# Patient Record
Sex: Male | Born: 2000 | Hispanic: No | Marital: Single | Smoking: Never smoker
Health system: Southern US, Community
[De-identification: ages and names within clinical notes are randomized; demographics above are authoritative.]

---

## 2020-08-07 ENCOUNTER — Other Ambulatory Visit: Payer: Self-pay

## 2020-08-07 ENCOUNTER — Emergency Department (HOSPITAL_COMMUNITY)
Admission: EM | Admit: 2020-08-07 | Discharge: 2020-08-07 | Disposition: A | Discharge status: Home / Self Care | Payer: Medicaid Other | Attending: Emergency Medicine | Admitting: Emergency Medicine

## 2020-08-07 DIAGNOSIS — H9201 Otalgia, right ear: Secondary | ICD-10-CM | POA: Diagnosis present

## 2020-08-07 DIAGNOSIS — H6121 Impacted cerumen, right ear: Secondary | ICD-10-CM

## 2020-08-07 MED ORDER — CARBAMIDE PEROXIDE 6.5 % OT SOLN
5.0000 [drp] | Freq: Once | OTIC | Status: AC
  Administered 2020-08-07: 5 [drp] via OTIC
  Filled 2020-08-07: qty 15

## 2020-08-07 NOTE — ED Provider Notes (Signed)
[facility] EMERGENCY DEPARTMENT Provider Note   CSN: [id number] Arrival date & time: 08/07/20  6546     History Chief Complaint  Patient presents with   Otalgia    Noah Michael is a 20 y.o. male.  HPI Patient is a 20 year old male who presents to the emergency department due to right ear pain.  Patient states that he has a history of recurring ear infections.  He tried flushing his ear last night but was unsuccessful.  States that he feels as if he has impacted earwax in the right ear.  Reports sinus drainage as well as pressure for the past week.  He has been taking DayQuil as well as NyQuil for this.  No other complaints.    No past medical history on file.  There are no problems to display for this patient.    No family history on file.     Home Medications Prior to Admission medications   Not on File    Allergies    Patient has no allergy information on record.  Review of Systems   Review of Systems  Constitutional:  Negative for fever.  HENT:  Positive for ear pain, rhinorrhea and sinus pressure. Negative for ear discharge.    Physical Exam Updated Vital Signs BP (!) 159/95 (BP Location: Left Arm)   Pulse 79   Temp 98.4 F (36.9 C) (Oral)   Resp 16   SpO2 100%   Physical Exam Vitals and nursing note reviewed.  Constitutional:      General: He is not in acute distress.    Appearance: He is well-developed.  HENT:     Head: Normocephalic and atraumatic.     Right Ear: External ear normal.     Left Ear: Tympanic membrane, ear canal and external ear normal. There is no impacted cerumen.     Ears:     Comments: Right ear and EAC appear normal.  Distal cerumen impaction noted.  Unable to visualize the right TM. Eyes:     General: No scleral icterus.       Right eye: No discharge.        Left eye: No discharge.     Conjunctiva/sclera: Conjunctivae normal.  Neck:     Trachea: No tracheal deviation.  Cardiovascular:     Rate and  Rhythm: Normal rate.  Pulmonary:     Effort: Pulmonary effort is normal. No respiratory distress.     Breath sounds: No stridor.  Abdominal:     General: There is no distension.  Musculoskeletal:        General: No swelling or deformity.     Cervical back: Neck supple.  Skin:    General: Skin is warm and dry.     Findings: No rash.  Neurological:     Mental Status: He is alert.     Cranial Nerves: Cranial nerve deficit: no gross deficits.   ED Results / Procedures / Treatments   Labs (all labs ordered are listed, but only abnormal results are displayed) Labs Reviewed - No data to display  EKG None  Radiology No results found.  Procedures Procedures   Medications Ordered in ED Medications  carbamide peroxide (DEBROX) 6.5 % OTIC (EAR) solution 5 drop (5 drops Right EAR Given 08/07/20 5035)    ED Course  I have reviewed the triage vital signs and the nursing notes.  Pertinent labs & imaging results that were available during my care of the patient were reviewed by me  and considered in my medical decision making (see chart for details).    MDM Rules/Calculators/A&P                          Patient is a 20 year old male who presents to the emergency department due to a right sided cerumen impaction as well as right ear pain.  Left ear, EAC, and TM appear normal.  Unable to visualize the right TM due to his cerumen impaction.  Myself as well as nursing staff have he irrigated the ear extensively with sterile saline as well as hydrogen peroxide.  I also placed Debrox drops.  I was able to remove a large amount of cerumen from the ear but could not completely resolve the impaction and thus could not visualize the right TM.  He states that his ear pain is actually improved significantly since having his ear irrigated.  Denies any persistent inner ear pain.  No drainage.  Patient given a referral to ENT.  Recommended he continue using the Debrox drops.  Discussed ear hygiene and  patient given information on ear hygiene as well.  Discussed return precautions.  Feel he is stable for discharge at this time and he is agreeable.    Final Clinical Impression(s) / ED Diagnoses Final diagnoses:  Impacted cerumen of right ear    Rx / DC Orders ED Discharge Orders     None        Placido Sou, PA-C 08/07/20 0943    Melene Plan, DO 08/08/20 1459

## 2020-08-07 NOTE — Discharge Instructions (Addendum)
Please continue to use your Debrox drops as needed.  I have attached information on ear irrigation as well as earwax buildup.  Below is the contact information for a local ear, nose, and throat clinic.  Please give them a call and schedule a follow-up appointment if your symptoms persist.  If you develop any new or worsening symptoms please come back to the emergency department.  It was a pleasure to meet you.

## 2020-08-07 NOTE — ED Triage Notes (Signed)
Pt bib EMS from home due to ear pain. 10/10 in right ear. Pt has history of recurring ear infections. Tried flushing ear at home. Noted sinus drainage x1 week.  Vitals WDL.

## 2020-08-16 ENCOUNTER — Other Ambulatory Visit: Payer: Self-pay

## 2020-08-16 ENCOUNTER — Encounter (HOSPITAL_COMMUNITY): Payer: Self-pay | Admitting: Emergency Medicine

## 2020-08-16 ENCOUNTER — Emergency Department (HOSPITAL_COMMUNITY)
Admission: EM | Admit: 2020-08-16 | Discharge: 2020-08-17 | Disposition: A | Discharge status: Home / Self Care | Payer: Medicaid Other | Attending: Student | Admitting: Student

## 2020-08-16 ENCOUNTER — Emergency Department (HOSPITAL_COMMUNITY): Payer: Medicaid Other

## 2020-08-16 DIAGNOSIS — M79641 Pain in right hand: Secondary | ICD-10-CM | POA: Diagnosis present

## 2020-08-16 DIAGNOSIS — Z5321 Procedure and treatment not carried out due to patient leaving prior to being seen by health care provider: Secondary | ICD-10-CM | POA: Insufficient documentation

## 2020-08-16 DIAGNOSIS — W228XXA Striking against or struck by other objects, initial encounter: Secondary | ICD-10-CM | POA: Insufficient documentation

## 2020-08-16 LAB — HIV ANTIBODY (ROUTINE TESTING W REFLEX): HIV Screen 4th Generation wRfx: NONREACTIVE

## 2020-08-16 NOTE — ED Provider Notes (Signed)
Emergency Medicine Provider Triage Evaluation Note  Noah Michael , a 20 y.o. male  was evaluated in triage.  Pt complains of right hand pain x1 week. He punched a door 1 week ago and has had constant pain since. He is left-hand dominant.  He is also requesting STD check. His partner tested positive for trich and chlamydia. No penile discharge, abdominal pain, fever/chills, or dysuria.  Review of Systems  Positive: arthralgia Negative: fever  Physical Exam  BP (!) 156/76 (BP Location: Right Arm)   Pulse (!) 120   Temp 98.4 F (36.9 C) (Oral)   Resp 16   SpO2 100%  Gen:   Awake, no distress   Resp:  Normal effort  MSK:   Moves extremities without difficulty  Other:  Deformity to right hand over 4th/5th metacarpal  Medical Decision Making  Medically screening exam initiated at 7:32 PM.  Appropriate orders placed.  Noah Michael was informed that the remainder of the evaluation will be completed by another provider, this initial triage assessment does not replace that evaluation, and the importance of remaining in the ED until their evaluation is complete.  X-ray STI panel   Noah Michael 08/16/20 1934    Noah Avena, MD 08/16/20 2047

## 2020-08-16 NOTE — ED Triage Notes (Signed)
Patient reports injury to right hand with swelling after punching a door last week . He is also requesting STD screening - denies any symptoms .

## 2020-08-17 LAB — RPR: RPR Ser Ql: NONREACTIVE

## 2020-08-24 ENCOUNTER — Encounter (HOSPITAL_COMMUNITY): Payer: Self-pay | Admitting: *Deleted

## 2020-08-24 ENCOUNTER — Other Ambulatory Visit: Payer: Self-pay

## 2020-08-24 ENCOUNTER — Emergency Department (HOSPITAL_COMMUNITY)
Admission: EM | Admit: 2020-08-24 | Discharge: 2020-08-24 | Disposition: A | Discharge status: Home / Self Care | Payer: Medicaid Other | Attending: Emergency Medicine | Admitting: Emergency Medicine

## 2020-08-24 DIAGNOSIS — W228XXA Striking against or struck by other objects, initial encounter: Secondary | ICD-10-CM | POA: Diagnosis not present

## 2020-08-24 DIAGNOSIS — S6991XA Unspecified injury of right wrist, hand and finger(s), initial encounter: Secondary | ICD-10-CM | POA: Diagnosis present

## 2020-08-24 DIAGNOSIS — M79641 Pain in right hand: Secondary | ICD-10-CM | POA: Insufficient documentation

## 2020-08-24 DIAGNOSIS — S62306A Unspecified fracture of fifth metacarpal bone, right hand, initial encounter for closed fracture: Secondary | ICD-10-CM | POA: Insufficient documentation

## 2020-08-24 NOTE — ED Triage Notes (Signed)
Pt was seen on 8/7 for same but left before completing care. He punched a wall and has right hand pain.

## 2020-08-24 NOTE — Discharge Instructions (Addendum)
Please follow-up with hand surgery.  Dr. Thomos Lemons information is below.  I would recommend giving them a call first thing tomorrow morning.  If you develop any new or worsening symptoms please come back to the emergency department.  It was good to see you again.

## 2020-08-24 NOTE — Progress Notes (Signed)
Orthopedic Tech Progress Note Patient Details:  Noah Michael 13-May-2000 [id number]  Ortho Devices Type of Ortho Device: Ulna gutter splint Ortho Device/Splint Location: RUE Ortho Device/Splint Interventions: Ordered, Application, Adjustment   Post Interventions Patient Tolerated: Well Instructions Provided: Care of device  [name] 08/24/2020, 6:20 PM

## 2020-08-24 NOTE — ED Provider Notes (Signed)
[facility] EMERGENCY DEPARTMENT Provider Note   CSN: [id number] Arrival date & time: 08/24/20  1125     History Chief Complaint  Patient presents with   Hand Pain    Noah Michael is a 20 y.o. male.  HPI Patient is a 20 year old left-hand-dominant male who presents to the emergency department due to right hand pain.  Patient states that he was angry and punched a metal door with a closed right fist on July 28.  He began experiencing pain and swelling along the dorsum of the hand.  He was initially evaluated on August 7 for this and had an x-ray with findings as noted below:  FINDINGS: There is a an impacted mildly angulated fracture in the distal aspect of the fifth metacarpal identified. No other fracture or dislocation is seen. No soft tissue abnormality is noted.  Patient initially left the emergency department before being evaluated on August 7.  He states that he has continued to have mild pain and swelling in the hand but states that it has mostly resolved.  Denies any numbness or weakness in the hand but notes a persistent deformity along the fifth metacarpal so he came back to the emergency department for evaluation.    History reviewed. No pertinent past medical history.  There are no problems to display for this patient.   History reviewed. No pertinent surgical history.     History reviewed. No pertinent family history.  Social History   Tobacco Use   Smoking status: Never  Substance Use Topics   Alcohol use: Never   Drug use: Never    Home Medications Prior to Admission medications   Not on File    Allergies    Patient has no known allergies.  Review of Systems   Review of Systems  Musculoskeletal:  Positive for arthralgias and joint swelling.  Skin:  Negative for wound.  Neurological:  Negative for weakness and numbness.   Physical Exam Updated Vital Signs BP 126/72 (BP Location: Right Arm)   Pulse 82   Temp 98.4 F  (36.9 C) (Oral)   Resp 15   SpO2 99%   Physical Exam Vitals and nursing note reviewed.  Constitutional:      General: He is not in acute distress.    Appearance: He is well-developed.  HENT:     Head: Normocephalic and atraumatic.     Right Ear: External ear normal.     Left Ear: External ear normal.  Eyes:     General: No scleral icterus.       Right eye: No discharge.        Left eye: No discharge.     Conjunctiva/sclera: Conjunctivae normal.  Neck:     Trachea: No tracheal deviation.  Cardiovascular:     Rate and Rhythm: Normal rate.  Pulmonary:     Effort: Pulmonary effort is normal. No respiratory distress.     Breath sounds: No stridor.  Abdominal:     General: There is no distension.  Musculoskeletal:        General: Tenderness and deformity present. No swelling.     Cervical back: Neck supple.     Comments: Mild TTP noted along the distal right fifth metacarpal.  Obvious deformity at the site.  Mild edema in the region.  No overlying skin changes.  No increased warmth.  Grip strength intact.  Distal sensation intact in all 5 fingers of the hand.  2+ radial pulses.  Skin:  General: Skin is warm and dry.     Findings: No rash.  Neurological:     Mental Status: He is alert.     Cranial Nerves: Cranial nerve deficit: no gross deficits.   ED Results / Procedures / Treatments   Labs (all labs ordered are listed, but only abnormal results are displayed) Labs Reviewed - No data to display  EKG None  Radiology No results found.  Procedures Procedures   Medications Ordered in ED Medications - No data to display  ED Course  I have reviewed the triage vital signs and the nursing notes.  Pertinent labs & imaging results that were available during my care of the patient were reviewed by me and considered in my medical decision making (see chart for details).    MDM Rules/Calculators/A&P                          Patient is a 20 year old left-hand-dominant  male who presents to the emergency department for evaluation of what appears to be a right fifth metacarpal fracture that occurred on July 28.  He initially came to the emergency department on August 7 and had an x-ray but left the emergency department before being evaluated.  Patient states his swelling and pain has almost completely resolved but still has a deformity in the region.  He is neurovascularly intact in the hand distal to the injury.  Will place in an ulnar gutter splint and give him a referral to hand surgery.  Recommended that he reach out to hand surgery first thing tomorrow morning to schedule an appointment for reevaluation.  Feel the patient is stable for discharge at this time and he is agreeable.  His questions were answered and he was amicable at the time of discharge.  Final Clinical Impression(s) / ED Diagnoses Final diagnoses:  Closed displaced fracture of fifth metacarpal bone of right hand, unspecified portion of metacarpal, initial encounter   Rx / DC Orders ED Discharge Orders     None        Placido Sou, PA-C 08/24/20 1819    Lorre Nick, MD 08/24/20 2251

## 2020-09-09 ENCOUNTER — Other Ambulatory Visit: Payer: Self-pay

## 2020-09-09 ENCOUNTER — Encounter: Payer: Self-pay | Admitting: Plastic Surgery

## 2020-09-09 ENCOUNTER — Ambulatory Visit (INDEPENDENT_AMBULATORY_CARE_PROVIDER_SITE_OTHER): Payer: Medicaid Other | Admitting: Plastic Surgery

## 2020-09-09 VITALS — BP 143/73 | HR 49 | Ht 68.0 in | Wt 143.4 lb

## 2020-09-09 DIAGNOSIS — S62336A Displaced fracture of neck of fifth metacarpal bone, right hand, initial encounter for closed fracture: Secondary | ICD-10-CM

## 2020-09-09 NOTE — Progress Notes (Signed)
About   Referring Provider No referring provider defined for this encounter.   CC:  Chief Complaint  Patient presents with   consult      Noah Michael is an 20 y.o. male.  HPI: Patient presents next weeks out from an injury to his right hand.  He suffered displaced fracture of the fifth metacarpal of his right hand.  He was initially seen for this in the emergency room and then followed up with the emergency room and was ultimately recommended to come to see me.  He was recommended a splint and given 1 in the emergency room but did not end up wearing it.  He feels like he has reasonable range of motion but is bothered by the bump on the dorsal aspect of his hand.  No Known Allergies  No outpatient encounter medications on file as of 09/09/2020.   No facility-administered encounter medications on file as of 09/09/2020.     No past medical history on file.  No past surgical history on file.  No family history on file.  Social History   Social History Narrative   Not on file     Review of Systems General: Denies fevers, chills, weight loss CV: Denies chest pain, shortness of breath, palpitations  Physical Exam Vitals with BMI 09/09/2020 08/24/2020 08/24/2020  Height 5\' 8"  - -  Weight 143 lbs 6 oz - -  BMI 21.81 - -  Systolic 143 126  Diastolic 73 72 76  Pulse 49 82 84    General:  No acute distress,  Alert and oriented, Non-Toxic, Normal speech and affect Right hand: Fingers well-perfused with normal capillary refill and palp radial pulse.  Sensation is intact.  He has full range of motion.  He has no rotational malalignment.  His x-ray was reviewed and is interpreted by me.  He has a displaced fracture of the metacarpal with volar displacement of the distal fracture segment with apex dorsal angulation of about 50 degrees.  Assessment/Plan Patient presents with a healed displaced malaligned fracture of the small finger metacarpal the right hand.  He has good  function and should compensate fine.  He has bothered by the bump on the dorsal aspect of his hand but I have asked him to give this some more time before considering surgical intervention for it.  He has no restrictions from my standpoint and I would consider him healed at this point.  I will plan to see him again in 6 weeks to reevaluate the bump on the dorsal aspect of his hand and to discuss options for that.  160 09/09/2020, 3:29 PM

## 2021-03-11 ENCOUNTER — Ambulatory Visit: Payer: Medicaid Other | Admitting: Plastic Surgery

## 2022-09-25 IMAGING — DX DG HAND COMPLETE 3+V*R*
3 series · 3 of 3 positions shown · non-contrast
Comparison: None.

CLINICAL DATA: Punched a door 1 week ago with persistent pain,
initial encounter

EXAM:
RIGHT HAND - COMPLETE 3+ VIEW

[hand pa]
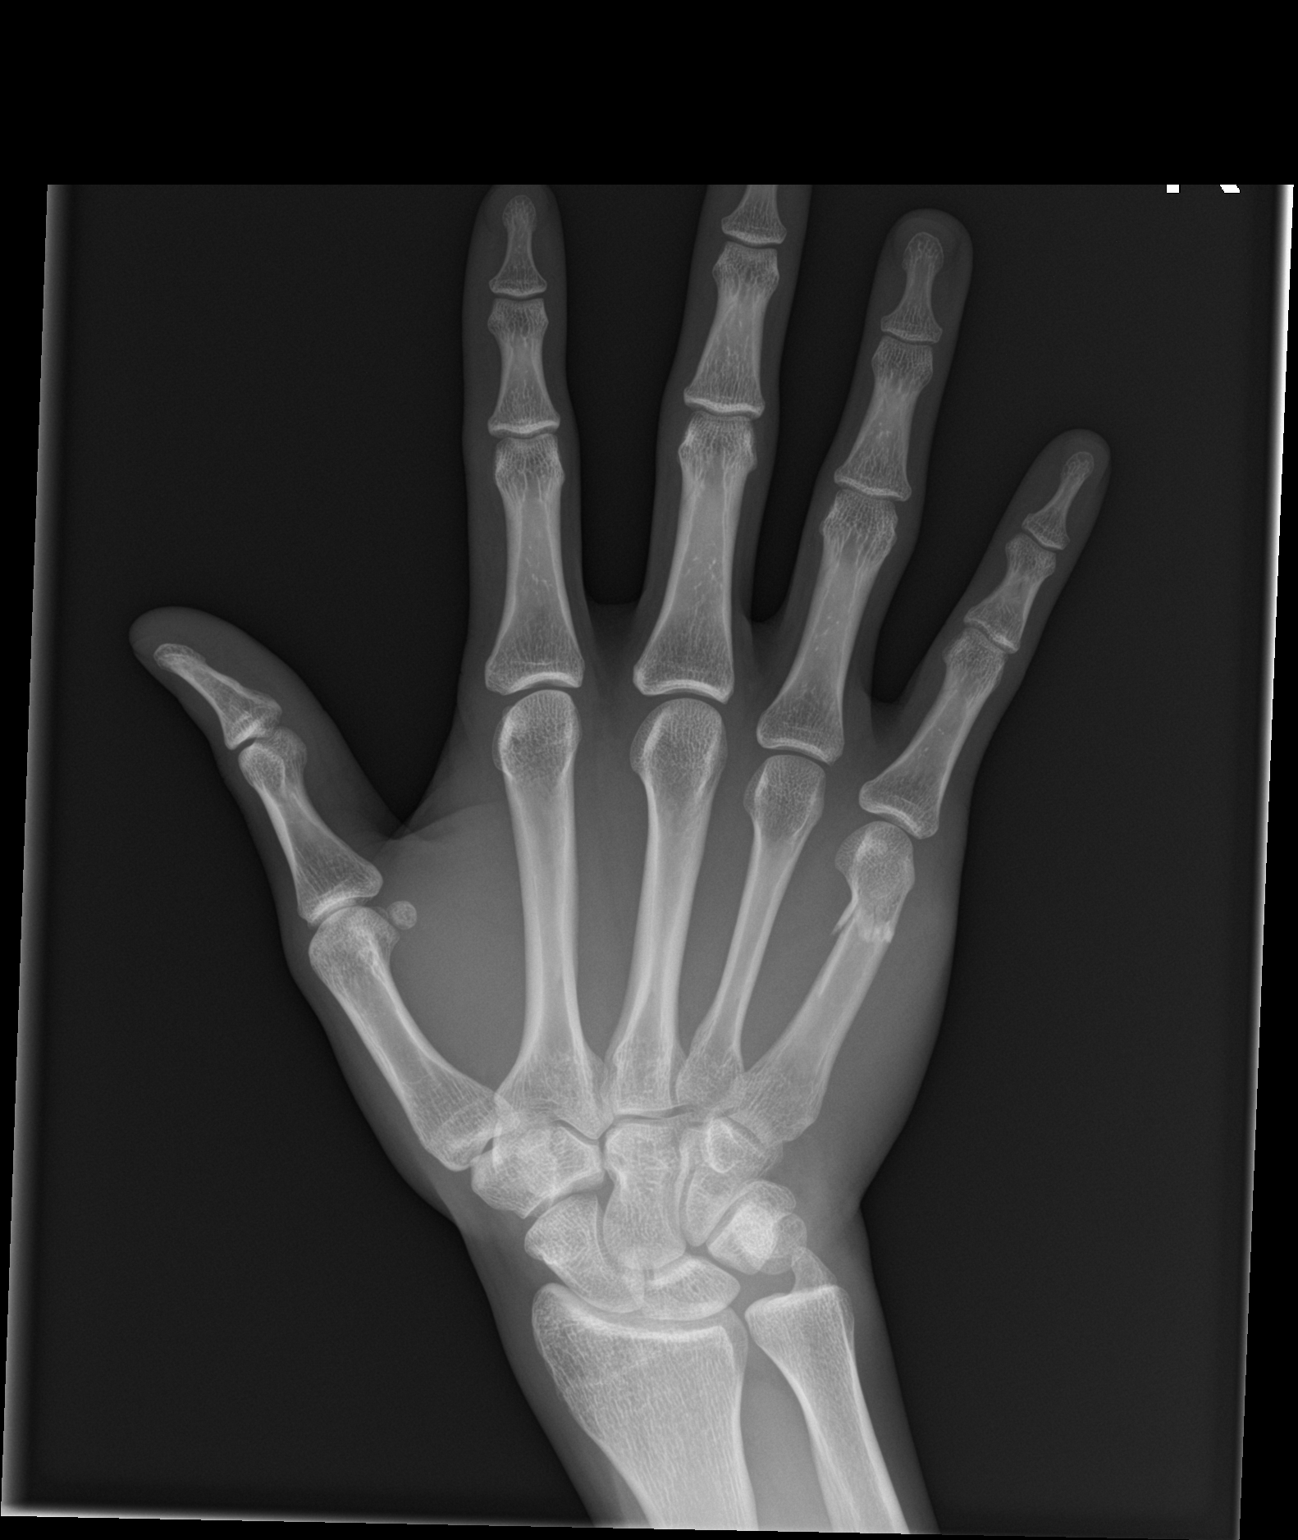

[hand obl]
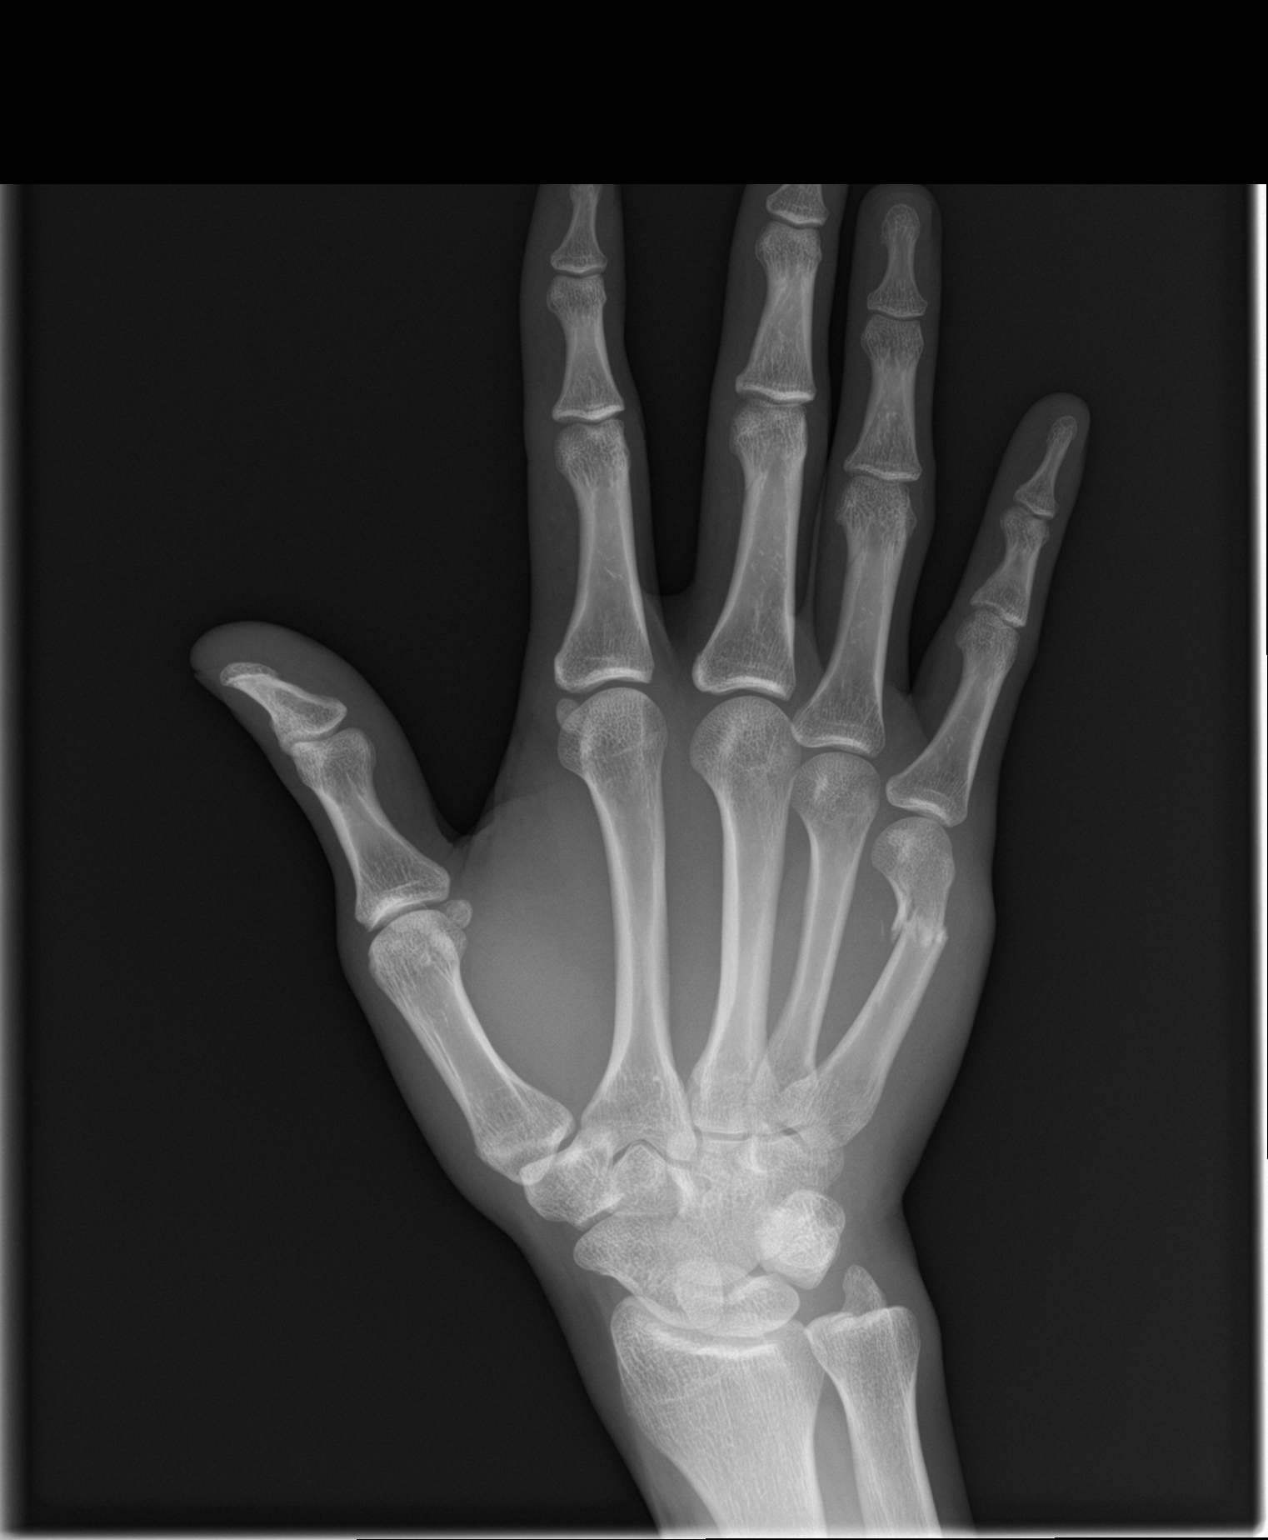

[hand lat]
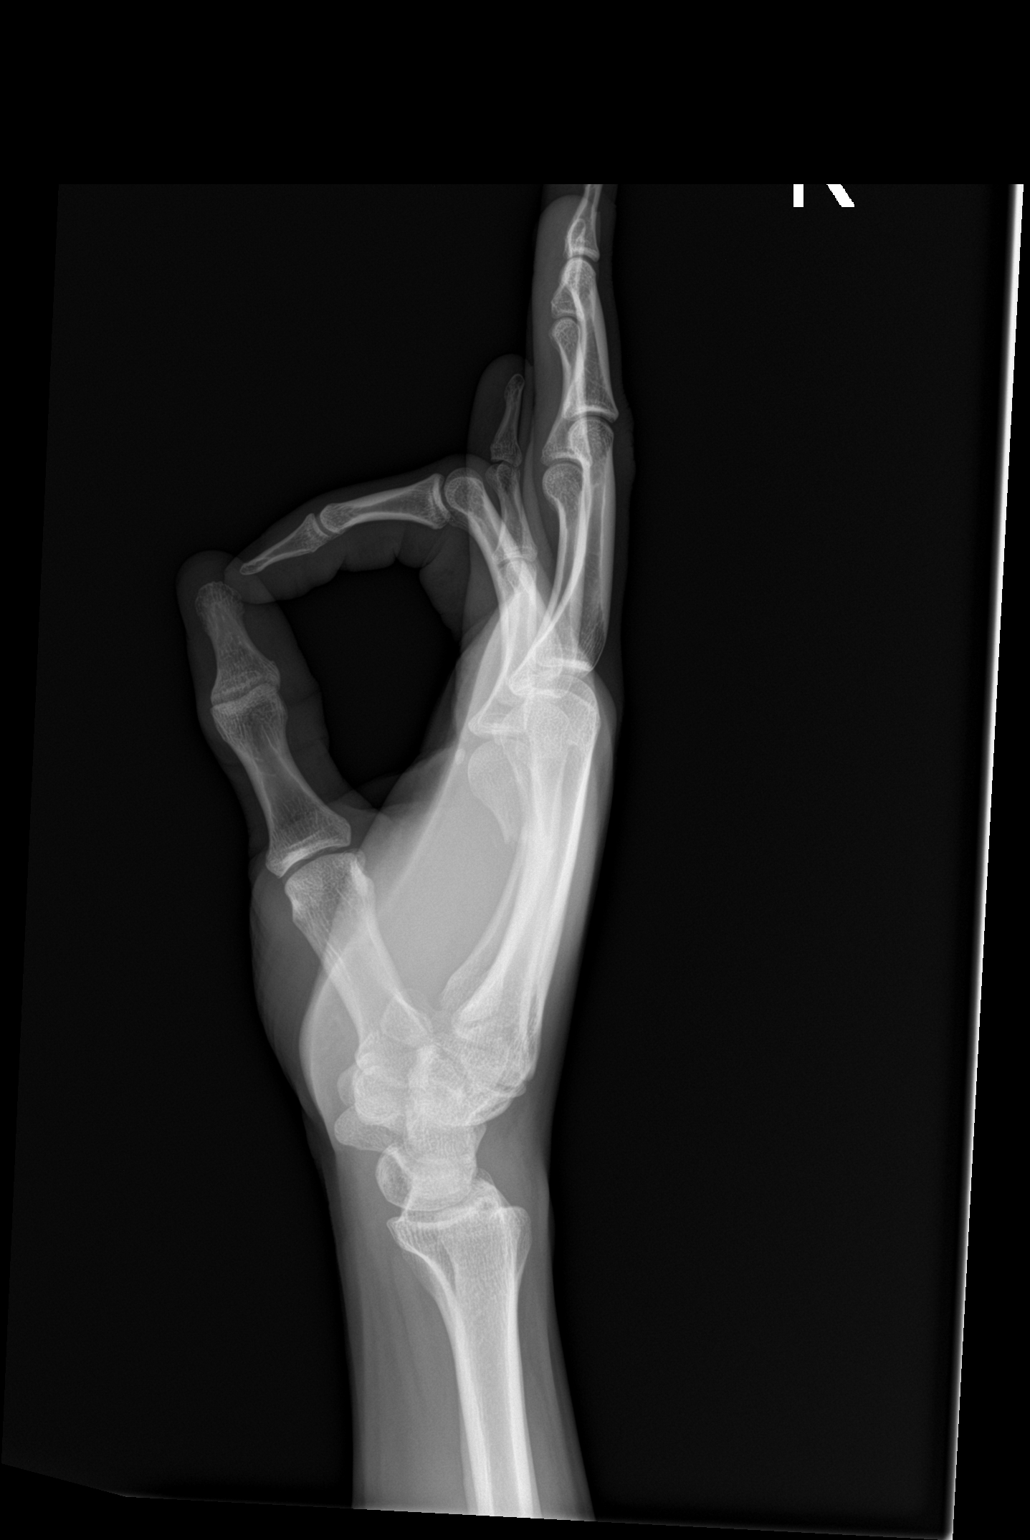

[3 of 3 positions shown; findings below may reference images not displayed]

FINDINGS: There is a an impacted mildly angulated fracture in the distal
aspect of the fifth metacarpal identified. No other fracture or
dislocation is seen. No soft tissue abnormality is noted.
IMPRESSION: Impacted and angulated distal fifth metacarpal fracture.
